# Patient Record
Sex: Male | Born: 1975 | Race: Black or African American | Hispanic: No | Marital: Single | Smoking: Never smoker
Health system: Southern US, Community
[De-identification: ages and names within clinical notes are randomized; demographics above are authoritative.]

---

## 2015-08-23 ENCOUNTER — Ambulatory Visit (INDEPENDENT_AMBULATORY_CARE_PROVIDER_SITE_OTHER): Payer: BLUE CROSS/BLUE SHIELD

## 2015-08-23 ENCOUNTER — Ambulatory Visit (INDEPENDENT_AMBULATORY_CARE_PROVIDER_SITE_OTHER): Payer: BLUE CROSS/BLUE SHIELD | Admitting: Physician Assistant

## 2015-08-23 VITALS — BP 142/104 | HR 112 | Temp 99.0°F | Resp 18 | Ht 73.0 in | Wt 255.0 lb

## 2015-08-23 DIAGNOSIS — M25474 Effusion, right foot: Secondary | ICD-10-CM

## 2015-08-23 DIAGNOSIS — M25571 Pain in right ankle and joints of right foot: Secondary | ICD-10-CM

## 2015-08-23 LAB — POCT CBC
Granulocyte percent: 80.5 %G — AB (ref 37–80)
HEMATOCRIT: 45.8 % (ref 43.5–53.7)
HEMOGLOBIN: 15.5 g/dL (ref 14.1–18.1)
LYMPH, POC: 2.2 (ref 0.6–3.4)
MCH: 27.5 pg (ref 27–31.2)
MCHC: 33.9 g/dL (ref 31.8–35.4)
MCV: 81 fL (ref 80–97)
MID (cbc): 0.5 (ref 0–0.9)
MPV: 6.6 fL (ref 0–99.8)
POC GRANULOCYTE: 11.4 — AB (ref 2–6.9)
POC LYMPH PERCENT: 15.6 %L (ref 10–50)
POC MID %: 3.9 % (ref 0–12)
Platelet Count, POC: 356 10*3/uL (ref 142–424)
RBC: 5.65 M/uL (ref 4.69–6.13)
RDW, POC: 14.8 %
WBC: 14.1 10*3/uL — AB (ref 4.6–10.2)

## 2015-08-23 LAB — URIC ACID: URIC ACID, SERUM: 8.7 mg/dL — AB (ref 4.0–7.8)

## 2015-08-23 MED ORDER — PREDNISONE 20 MG PO TABS: 40.0000 mg | ORAL_TABLET | Freq: Every day | ORAL | Status: DC

## 2015-08-23 NOTE — Patient Instructions (Signed)
Your toe pain is likely gout.  Please take the prednisone 2 tabs daily for 5 days. If you start having fevers or chills, notice the pain worsening, or notice redness expanding around the area, be sure to come back or go to the ER asap!   Gout Gout is an inflammatory arthritis caused by a buildup of uric acid crystals in the joints. Uric acid is a chemical that is normally present in the blood. When the level of uric acid in the blood is too high it can form crystals that deposit in your joints and tissues. This causes joint redness, soreness, and swelling (inflammation). Repeat attacks are common. Over time, uric acid crystals can form into masses (tophi) near a joint, destroying bone and causing disfigurement. Gout is treatable and often preventable. CAUSES  The disease begins with elevated levels of uric acid in the blood. Uric acid is produced by your body when it breaks down a naturally found substance called purines. Certain foods you eat, such as meats and fish, contain high amounts of purines. Causes of an elevated uric acid level include:  Being passed down from parent to child (heredity).  Diseases that cause increased uric acid production (such as obesity, psoriasis, and certain cancers).  Excessive alcohol use.  Diet, especially diets rich in meat and seafood.  Medicines, including certain cancer-fighting medicines (chemotherapy), water pills (diuretics), and aspirin.  Chronic kidney disease. The kidneys are no longer able to remove uric acid well.  Problems with metabolism. Conditions strongly associated with gout include:  Obesity.  High blood pressure.  High cholesterol.  Diabetes. Not everyone with elevated uric acid levels gets gout. It is not understood why some people get gout and others do not. Surgery, joint injury, and eating too much of certain foods are some of the factors that can lead to gout attacks. SYMPTOMS   An attack of gout comes on quickly. It causes  intense pain with redness, swelling, and warmth in a joint.  Fever can occur.  Often, only one joint is involved. Certain joints are more commonly involved:  Base of the big toe.  Knee.  Ankle.  Wrist.  Finger. Without treatment, an attack usually goes away in a few days to weeks. Between attacks, you usually will not have symptoms, which is different from many other forms of arthritis. DIAGNOSIS  Your caregiver will suspect gout based on your symptoms and exam. In some cases, tests may be recommended. The tests may include:  Blood tests.  Urine tests.  X-rays.  Joint fluid exam. This exam requires a needle to remove fluid from the joint (arthrocentesis). Using a microscope, gout is confirmed when uric acid crystals are seen in the joint fluid. TREATMENT  There are two phases to gout treatment: treating the sudden onset (acute) attack and preventing attacks (prophylaxis).  Treatment of an Acute Attack.  Medicines are used. These include anti-inflammatory medicines or steroid medicines.  An injection of steroid medicine into the affected joint is sometimes necessary.  The painful joint is rested. Movement can worsen the arthritis.  You may use warm or cold treatments on painful joints, depending which works best for you.  Treatment to Prevent Attacks.  If you suffer from frequent gout attacks, your caregiver may advise preventive medicine. These medicines are started after the acute attack subsides. These medicines either help your kidneys eliminate uric acid from your body or decrease your uric acid production. You may need to stay on these medicines for a very long  time.  The early phase of treatment with preventive medicine can be associated with an increase in acute gout attacks. For this reason, during the first few months of treatment, your caregiver may also advise you to take medicines usually used for acute gout treatment. Be sure you understand your caregiver's  directions. Your caregiver may make several adjustments to your medicine dose before these medicines are effective.  Discuss dietary treatment with your caregiver or dietitian. Alcohol and drinks high in sugar and fructose and foods such as meat, poultry, and seafood can increase uric acid levels. Your caregiver or dietitian can advise you on drinks and foods that should be limited. HOME CARE INSTRUCTIONS   Do not take aspirin to relieve pain. This raises uric acid levels.  Only take over-the-counter or prescription medicines for pain, discomfort, or fever as directed by your caregiver.  Rest the joint as much as possible. When in bed, keep sheets and blankets off painful areas.  Keep the affected joint raised (elevated).  Apply warm or cold treatments to painful joints. Use of warm or cold treatments depends on which works best for you.  Use crutches if the painful joint is in your leg.  Drink enough fluids to keep your urine clear or pale yellow. This helps your body get rid of uric acid. Limit alcohol, sugary drinks, and fructose drinks.  Follow your dietary instructions. Pay careful attention to the amount of protein you eat. Your daily diet should emphasize fruits, vegetables, whole grains, and fat-free or low-fat milk products. Discuss the use of coffee, vitamin C, and cherries with your caregiver or dietitian. These may be helpful in lowering uric acid levels.  Maintain a healthy body weight. SEEK MEDICAL CARE IF:   You develop diarrhea, vomiting, or any side effects from medicines.  You do not feel better in 24 hours, or you are getting worse. SEEK IMMEDIATE MEDICAL CARE IF:   Your joint becomes suddenly more tender, and you have chills or a fever. MAKE SURE YOU:   Understand these instructions.  Will watch your condition.  Will get help right away if you are not doing well or get worse.   This information is not intended to replace advice given to you by your health  care provider. Make sure you discuss any questions you have with your health care provider.   Document Released: 09/02/2000 Document Revised: 09/26/2014 Document Reviewed: 04/18/2012 Elsevier Interactive Patient Education [employer]

## 2015-08-23 NOTE — Progress Notes (Signed)
   Subjective:    Patient ID: Bruce Mooney, male    DOB: 1975-10-07, 39 y.o.   MRN: [id number]  Chief Complaint  Patient presents with  . Foot Swelling    right, last night, initially started about 1 week ago   Medications, allergies, past medical history, surgical history, family history, social history and problem list reviewed and updated.  HPI  [age] yom presents with right foot pain, swelling.   Symptoms started one week ago with pain/swelling across dorsum of right foot. No trauma. Persistent/moderate pain for several days. Yesterday afternoon he noticed the pain suddenly moving to area of lateral malleolus, severity much worse to 8/10. Has been constant since yesterday. No trauma. Has been too tender to put much weight on it since yest.   Denies fevers, chills. No injury. No hx or family hx gout. Drank large amount of beer (6) yesterday, more than normal.   Review of Systems See HPI     Objective:   Physical Exam  Constitutional: He appears well-developed and well-nourished.  Non-toxic appearance. He does not have a sickly appearance. He does not appear ill. No distress.  BP 142/104 mmHg  Pulse 112  Temp(Src) 99 F (37.2 C)  Resp 18  Ht 6\' 1"  (1.854 m)  Wt 255 lb (115.667 kg)  BMI 33.65 kg/m2  SpO2 98%   Musculoskeletal:       Right knee: Normal.       Feet:  Exquisite tenderness over, around right lateral malleolus. Warmth over area. Mild swelling over area. No erythema. Limited range of motion due to pain. Normal sensation. 2+ dp pulse.   Mild ttp across dorsum of foot.    [facility] reading (PRIMARY) by  Dr. Milus GlazierLauenstein. Right foot/ankle findings: Soft tissue swelling. No bony abnormality.   Results for orders placed or performed in visit on 08/23/15  POCT CBC  Result Value Ref Range   WBC 14.1 (A) 4.6 - 10.2 K/uL   Lymph, poc 2.2 0.6 - 3.4   POC LYMPH PERCENT 15.6 10 - 50 %L   MID (cbc) 0.5 0 - 0.9   POC MID % 3.9 0 - 12 %M   POC Granulocyte 11.4 (A) 2 - 6.9     Granulocyte percent 80.5 (A) 37 - 80 %G   RBC 5.65 4.69 - 6.13 M/uL   Hemoglobin 15.5 14.1 - 18.1 g/dL   HCT, POC 40.945.8 81.143.5 - 53.7 %   MCV 81.0 80 - 97 fL   MCH, POC 27.5 27 - 31.2 pg   MCHC 33.9 31.8 - 35.4 g/dL   RDW, POC 91.414.8 %   Platelet Count, POC 356 142 - 424 K/uL   MPV 6.6 0 - 99.8 fL       Assessment & Plan:   Right ankle pain - Plan: POCT CBC, Uric Acid, DG Foot Complete Right, DG Ankle Complete Right  Swelling of foot joint, right - Plan: DG Foot Complete Right, DG Ankle Complete Right --no trauma, xrays unremarkable --suspect gout as pain is exquisite, came on suddenly despite no trauma, warm to touch, drank 6 beers one day prior to onset --prednisone burst --checking uric acid today --leukocytosis noted, pt afebrile, pt instructed to rtc if pain worsens, area becomes red, or he starts having fevers or chills  Donnajean Lopesodd M. Valmai Vandenberghe, PA-C Physician Assistant-Certified [facility]  08/23/2015 11:15 AM

## 2016-01-25 ENCOUNTER — Ambulatory Visit (INDEPENDENT_AMBULATORY_CARE_PROVIDER_SITE_OTHER): Payer: 59 | Admitting: Family Medicine

## 2016-01-25 VITALS — BP 136/92 | HR 106 | Temp 98.3°F | Resp 18 | Ht 73.0 in | Wt 253.0 lb

## 2016-01-25 DIAGNOSIS — M10072 Idiopathic gout, left ankle and foot: Secondary | ICD-10-CM

## 2016-01-25 MED ORDER — INDOMETHACIN 50 MG PO CAPS: 50.0000 mg | ORAL_CAPSULE | Freq: Three times a day (TID) | ORAL | Status: AC | PRN

## 2016-01-25 MED ORDER — PREDNISONE 20 MG PO TABS: 40.0000 mg | ORAL_TABLET | Freq: Every day | ORAL | Status: AC

## 2016-01-25 NOTE — Progress Notes (Signed)
Subjective:  By signing my name below, I, Bruce Mooney, attest that this documentation has been prepared under the direction and in the presence of Norberto Sorenson, MD. Electronically Signed: Stann Mooney, Scribe. 01/25/2016 , 5:36 PM .  Patient was seen in Room 2 .   Patient ID: Bruce Mooney, male    DOB: 05/04/1976, 40 y.o.   MRN: [id number] Chief Complaint  Patient presents with  . Foot Injury    pain, left, x 1 day   HPI Bruce Mooney is a 40 y.o. male who has h/o gout presents to [facility] complaining of left foot pain that started last night.  He was seen for right foot pain and had gout 5 months ago, as uric acid was 8.7. He did have white blood count of 14 with left shift. He was treated with prednisone burst.   Patient states left foot pain starting "out of the blue" last night. He's been trying to keep a controlled diet with baked chicken and steamed vegetables. But he did mention attending a barbeque over the past 2 days. He denies it feeling like previous gout flare. He denies any known injury to the area. He denies any recent change in his shoes. He denies taking OTC medications for this.   He has history of heel spurs and was seen at Urgent Care in [place]. He was sent to podiatry in [facility] for shoe slips for relief. He mentions occasional popping in his lower extremities.   He informs seeing his eye doctor this morning for a stye in his left eye. He was prescribed doxycycline.   History reviewed. No pertinent past medical history. Prior to Admission medications   Not on File   No Known Allergies  Review of Systems  Constitutional: Negative for fever, chills and fatigue.  Gastrointestinal: Negative for nausea, vomiting and diarrhea.  Musculoskeletal: Positive for joint swelling, arthralgias and gait problem. Negative for myalgias.  Skin: Negative for color change, rash and wound.       Objective:   Physical Exam  Constitutional: He is oriented to  person, place, and time. He appears well-developed and well-nourished. No distress.  HENT:  Head: Normocephalic and atraumatic.  Eyes: EOM are normal. Pupils are equal, round, and reactive to light.  Neck: Neck supple.  Cardiovascular: Normal rate.   Pulses:      Dorsalis pedis pulses are 2+ on the left side.       Posterior tibial pulses are 2+ on the left side.  Pulmonary/Chest: Effort normal. No respiratory distress.  Musculoskeletal: Normal range of motion.  Tender anterior to the lateral malleolus, ankle and lateral proximal foot with warmth and slight swelling; no erythema, no hyperesthesia; no tenderness over 5th metatarsal, trace pitting edema Antalgic gait with slight inversion of the ankle, slightly rolled to the medial aspect of the foot  Neurological: He is alert and oriented to person, place, and time.  Skin: Skin is warm and dry.  Psychiatric: He has a normal mood and affect. His behavior is normal.  Nursing note and vitals reviewed.   BP 136/92 mmHg  Pulse 106  Temp(Src) 98.3 F (36.8 C)  Resp 18  Ht  (1.854 m)  Wt 253 lb (114.76 kg)  BMI 33.39 kg/m2  SpO2 98%    Assessment & Plan:   1. Acute idiopathic gout of left foot   Reviewed diet changes to prevent gout flairs. Treat with prednisone which he responded very well to prior.  In future, can start indomethacin  at first sign of flair to try to abort. If cont to have gout flairs will try colcrys and cons preventative allopurinol after rechecking uric acid for baseline.  Meds ordered this encounter  Medications  . predniSONE (DELTASONE) 20 MG tablet    Sig: Take 2 tablets (40 mg total) by mouth daily with breakfast.    Dispense:  10 tablet    Refill:  0  . indomethacin (INDOCIN) 50 MG capsule    Sig: Take 1 capsule (50 mg total) by mouth 3 (three) times daily as needed for moderate pain (at first sign of gout flair, take with food).    Dispense:  30 capsule    Refill:  2    I personally performed the  services described in this documentation, which was scribed in my presence. The recorded information has been reviewed and considered, and addended by me as needed.  Norberto SorensonEva Shaw, MD MPH

## 2016-01-25 NOTE — Patient Instructions (Addendum)
You can reduce your uric acid level by avoiding red meat, saturated fats (dairy fats, butter fats, animal fats), high fructose corn syrup, and beer and drinking more water.    IF you received an x-ray today, you will receive an invoice from [facility]. Please contact [facility] at [phone] with questions or concerns regarding your invoice.   IF you received labwork today, you will receive an invoice from [employer]. Please contact [facility] at [phone] with questions or concerns regarding your invoice.   Our billing staff will not be able to assist you with questions regarding bills from these companies.  You will be contacted with the lab results as soon as they are available. The fastest way to get your results is to activate your My Chart account. Instructions are located on the last page of this paperwork. If you have not heard from [place] regarding the results in 2 weeks, please contact this office.     Gout Gout is an inflammatory arthritis caused by a buildup of uric acid crystals in the joints. Uric acid is a chemical that is normally present in the blood. When the level of uric acid in the blood is too high it can form crystals that deposit in your joints and tissues. This causes joint redness, soreness, and swelling (inflammation). Repeat attacks are common. Over time, uric acid crystals can form into masses (tophi) near a joint, destroying bone and causing disfigurement. Gout is treatable and often preventable. CAUSES  The disease begins with elevated levels of uric acid in the blood. Uric acid is produced by your body when it breaks down a naturally found substance called purines. Certain foods you eat, such as meats and fish, contain high amounts of purines. Causes of an elevated uric acid level include:  Being passed down from parent to child (heredity).  Diseases that cause increased uric acid production (such as obesity,  psoriasis, and certain cancers).  Excessive alcohol use.  Diet, especially diets rich in meat and seafood.  Medicines, including certain cancer-fighting medicines (chemotherapy), water pills (diuretics), and aspirin.  Chronic kidney disease. The kidneys are no longer able to remove uric acid well.  Problems with metabolism. Conditions strongly associated with gout include:  Obesity.  High blood pressure.  High cholesterol.  Diabetes. Not everyone with elevated uric acid levels gets gout. It is not understood why some people get gout and others do not. Surgery, joint injury, and eating too much of certain foods are some of the factors that can lead to gout attacks. SYMPTOMS   An attack of gout comes on quickly. It causes intense pain with redness, swelling, and warmth in a joint.  Fever can occur.  Often, only one joint is involved. Certain joints are more commonly involved:  Base of the big toe.  Knee.  Ankle.  Wrist.  Finger. Without treatment, an attack usually goes away in a few days to weeks. Between attacks, you usually will not have symptoms, which is different from many other forms of arthritis. DIAGNOSIS  Your caregiver will suspect gout based on your symptoms and exam. In some cases, tests may be recommended. The tests may include:  Blood tests.  Urine tests.  X-rays.  Joint fluid exam. This exam requires a needle to remove fluid from the joint (arthrocentesis). Using a microscope, gout is confirmed when uric acid crystals are seen in the joint fluid. TREATMENT  There are two phases to gout treatment: treating the sudden onset (acute) attack and  preventing attacks (prophylaxis).  Treatment of an Acute Attack.  Medicines are used. These include anti-inflammatory medicines or steroid medicines.  An injection of steroid medicine into the affected joint is sometimes necessary.  The painful joint is rested. Movement can worsen the arthritis.  You may  use warm or cold treatments on painful joints, depending which works best for you.  Treatment to Prevent Attacks.  If you suffer from frequent gout attacks, your caregiver may advise preventive medicine. These medicines are started after the acute attack subsides. These medicines either help your kidneys eliminate uric acid from your body or decrease your uric acid production. You may need to stay on these medicines for a very long time.  The early phase of treatment with preventive medicine can be associated with an increase in acute gout attacks. For this reason, during the first few months of treatment, your caregiver may also advise you to take medicines usually used for acute gout treatment. Be sure you understand your caregiver's directions. Your caregiver may make several adjustments to your medicine dose before these medicines are effective.  Discuss dietary treatment with your caregiver or dietitian. Alcohol and drinks high in sugar and fructose and foods such as meat, poultry, and seafood can increase uric acid levels. Your caregiver or dietitian can advise you on drinks and foods that should be limited. HOME CARE INSTRUCTIONS   Do not take aspirin to relieve pain. This raises uric acid levels.  Only take over-the-counter or prescription medicines for pain, discomfort, or fever as directed by your caregiver.  Rest the joint as much as possible. When in bed, keep sheets and blankets off painful areas.  Keep the affected joint raised (elevated).  Apply warm or cold treatments to painful joints. Use of warm or cold treatments depends on which works best for you.  Use crutches if the painful joint is in your leg.  Drink enough fluids to keep your urine clear or pale yellow. This helps your body get rid of uric acid. Limit alcohol, sugary drinks, and fructose drinks.  Follow your dietary instructions. Pay careful attention to the amount of protein you eat. Your daily diet should  emphasize fruits, vegetables, whole grains, and fat-free or low-fat milk products. Discuss the use of coffee, vitamin C, and cherries with your caregiver or dietitian. These may be helpful in lowering uric acid levels.  Maintain a healthy body weight. SEEK MEDICAL CARE IF:   You develop diarrhea, vomiting, or any side effects from medicines.  You do not feel better in 24 hours, or you are getting worse. SEEK IMMEDIATE MEDICAL CARE IF:   Your joint becomes suddenly more tender, and you have chills or a fever. MAKE SURE YOU:   Understand these instructions.  Will watch your condition.  Will get help right away if you are not doing well or get worse.   This information is not intended to replace advice given to you by your health care provider. Make sure you discuss any questions you have with your health care provider.   Document Released: 09/02/2000 Document Revised: 09/26/2014 Document Reviewed: 04/18/2012 Elsevier Interactive Patient Education 2016 [employer]  There are some easy diet adjustments that you can make to lower your blood uric acid level and thereby hopefully never have to suffer from another gout flair (or at least less frequently).  You should avoid alcohol, drink plenty of water, and try to follow a "low purine" diet as your body produces uric acid when it breaks down  prurines-substances that are found naturally in your body, as well as in certain foods such as organ meats, anchioves, herring, asparagus, and mushrooms. Also, increasing your diet in certain foods that may lower uric acid levels is a pretty safe way to decrease your likelihood of gout so consider drinking coffee (regular and/or decaf), eating fruits with Vitamin C in them such as citrus fruits, strawberries, broccoli,  brussel sprouts, papaya, and cantaloupe (though megadoses of vitamin C supplements may do the opposite and increase your body's uric acid levels), and/or eating more cherries and other  dark-colored fruits, such as blackberries, blueberries, purple grapes and raspberries. In addition, getting plenty of vitamin A though yellow fruits, or dark green/yellow vegetables at least every other day is good.  Other general diet guidelines for people with gout who need to lower their blood uric acid levels are as follows:  Drink 8 to 16 cups ( about 2 to 4 liters) of fluid each day, with at least half being water. Eat a moderate amount of protein, preferably from healthy sources, such as low-fat or fat-free dairy, tofu, eggs, and nut butters. Limit your daily intake of meat, fish, and poultry to 4 to 6 ounces. Avoid high fat meats and desserts. Decrease your intake of shellfish, beef, lamb, pork, eggs and cheese. Avoid drastic weight reduction or fasting.  If weight loss is desired lose it over a period of several months.

## 2017-03-08 ENCOUNTER — Telehealth: Payer: Self-pay | Admitting: Family Medicine

## 2017-03-08 NOTE — Telephone Encounter (Signed)
PATIENT WOULD LIKE TO ASK DR. SHAW IF SHE WILL REFILL HIS MEDICATION FOR HIS GOUT? IT IS INDOMETHACIN (INDOCIN) 50 MG. HE GOT A FLARE-UP ON Friday 03/03/17 IN HIS (R) FOOT.  BEST PHONE [phone] (CELL) PHARMACY CHOICE IS [facility] ON [address]. MBC

## 2017-03-09 NOTE — Telephone Encounter (Signed)
Spoke with patient and advised that he needed to come in for an OV since its been over a year since he has been seen, to get refills on medication for his gout. Pt denied being transferred to the front desk to make an appointment and stated that he would call back if it gets worse.

## 2017-03-09 NOTE — Telephone Encounter (Signed)
Dr. Shaw, please see this message. Thanks. 

## 2017-03-09 NOTE — Telephone Encounter (Signed)
No - I cannot refill since he has not been seen in our office in > 1  Yr. Please offer same day appt with other provider. Or I can see him on Sat

## 2017-05-23 IMAGING — CR DG ANKLE COMPLETE 3+V*R*
4 series · 4 of 4 positions shown · non-contrast
Comparison: None.

CLINICAL DATA: Foot and ankle pain of unknown origin.

EXAM:
RIGHT FOOT COMPLETE - 3+ VIEW; RIGHT ANKLE - COMPLETE 3+ VIEW

[AP]
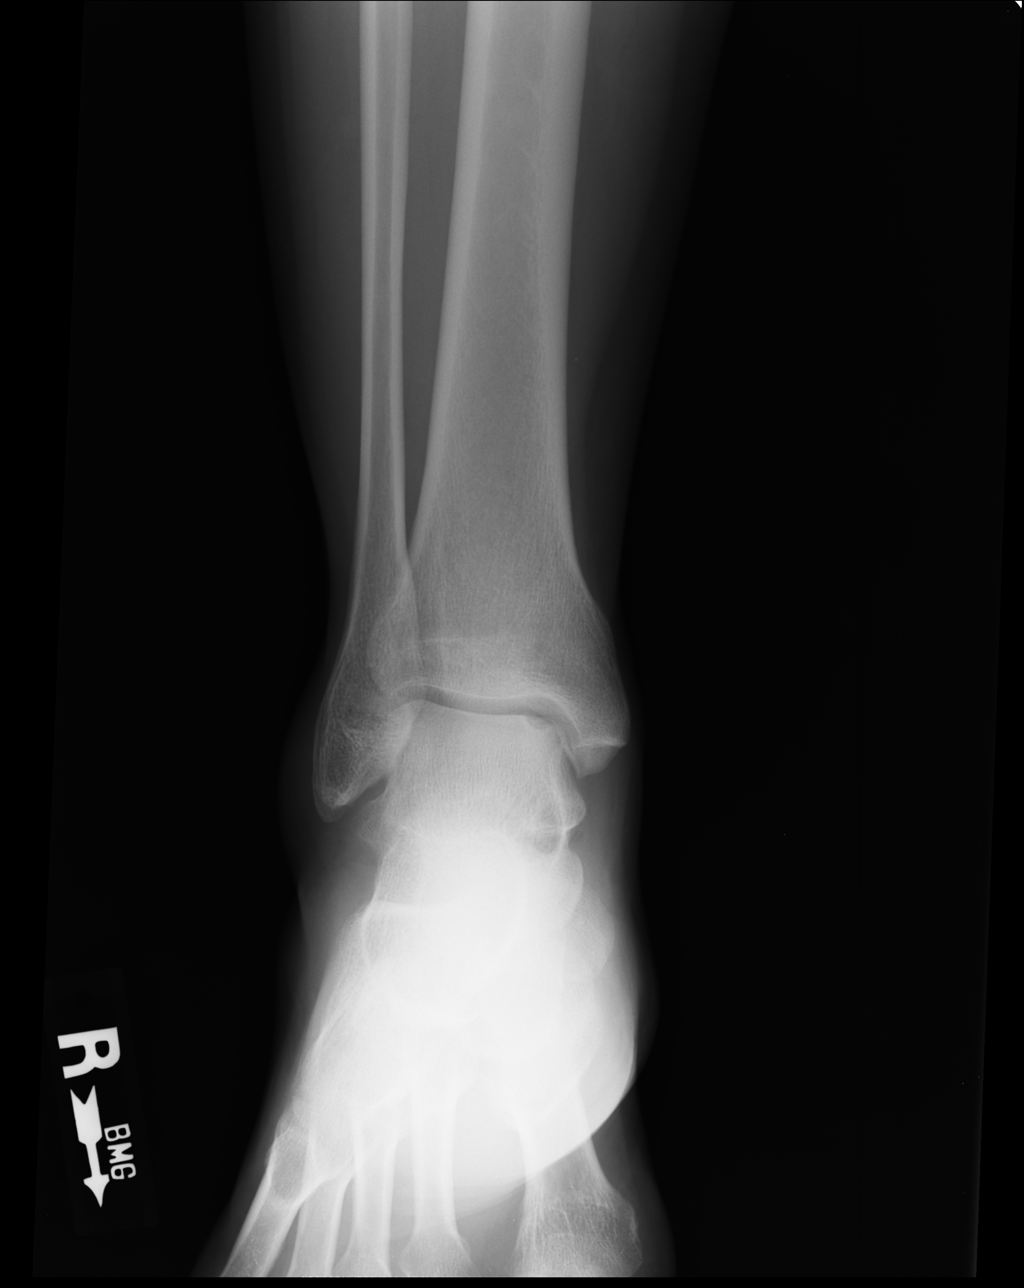

[ap obl int rot]
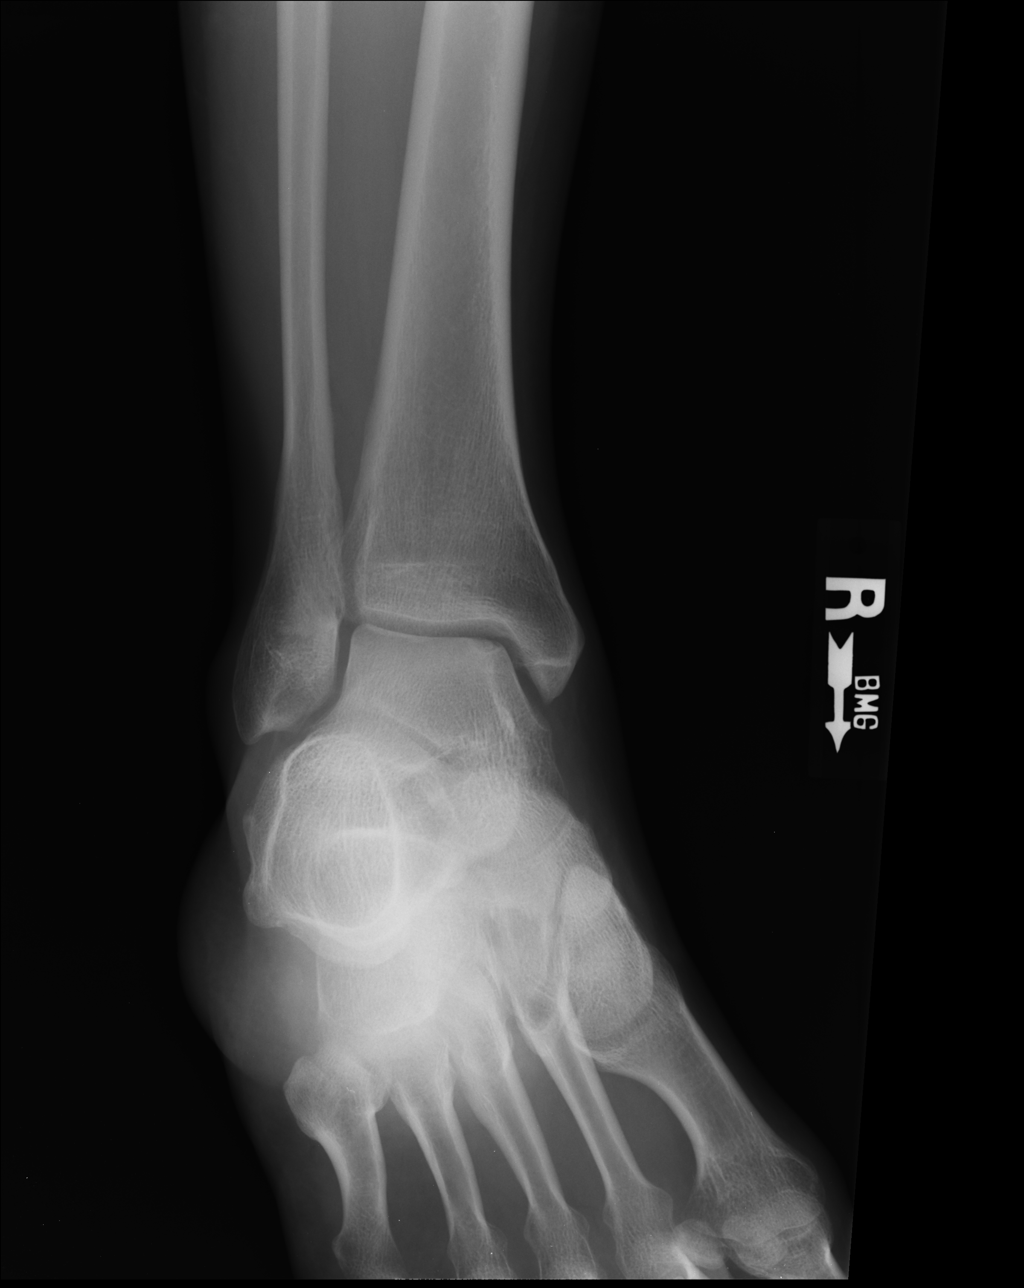

[medial obl]
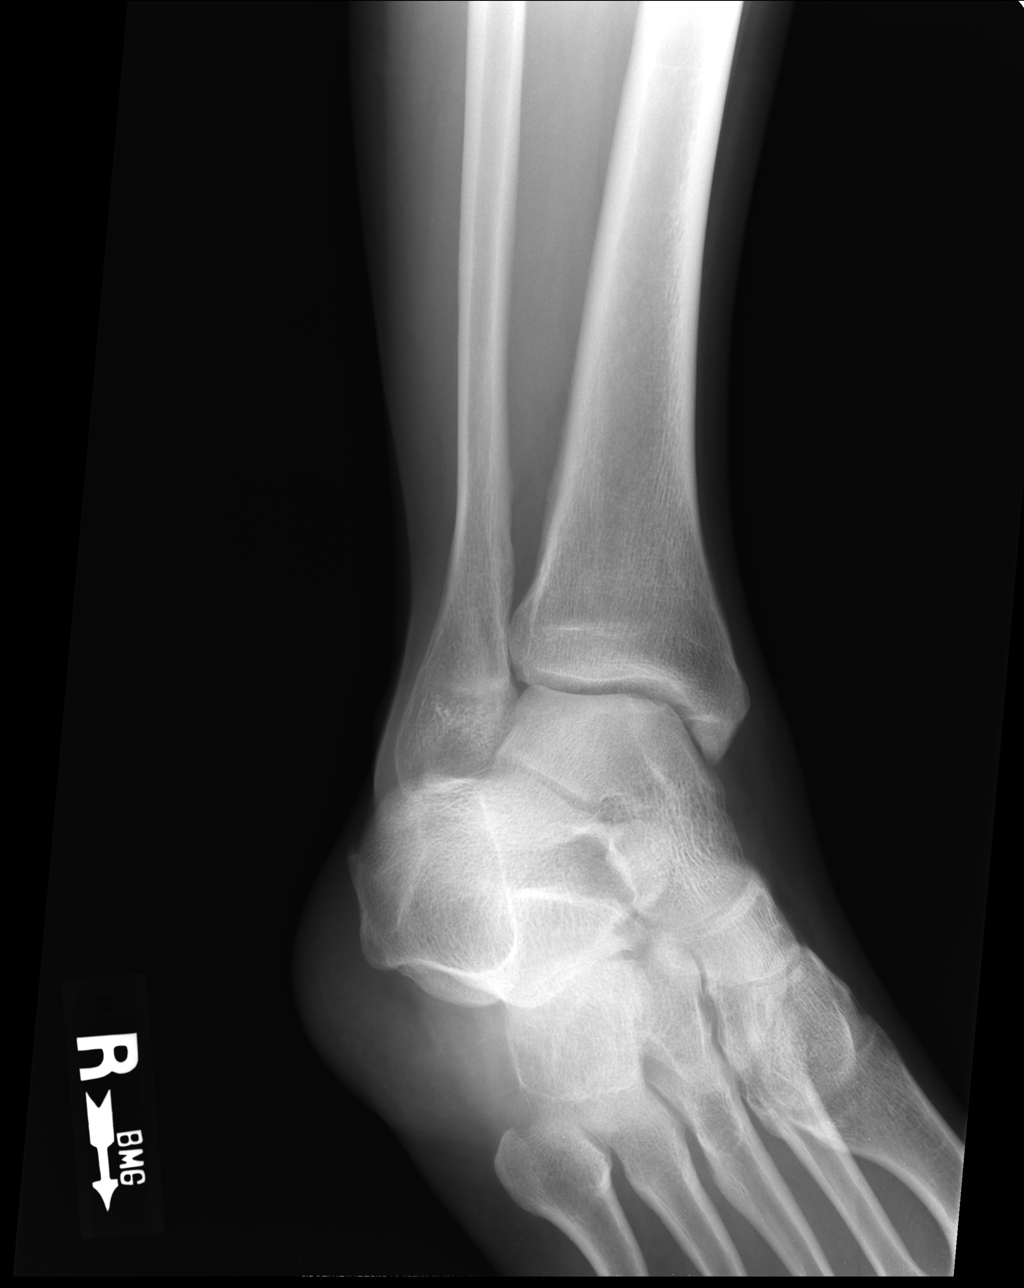

[lateral]
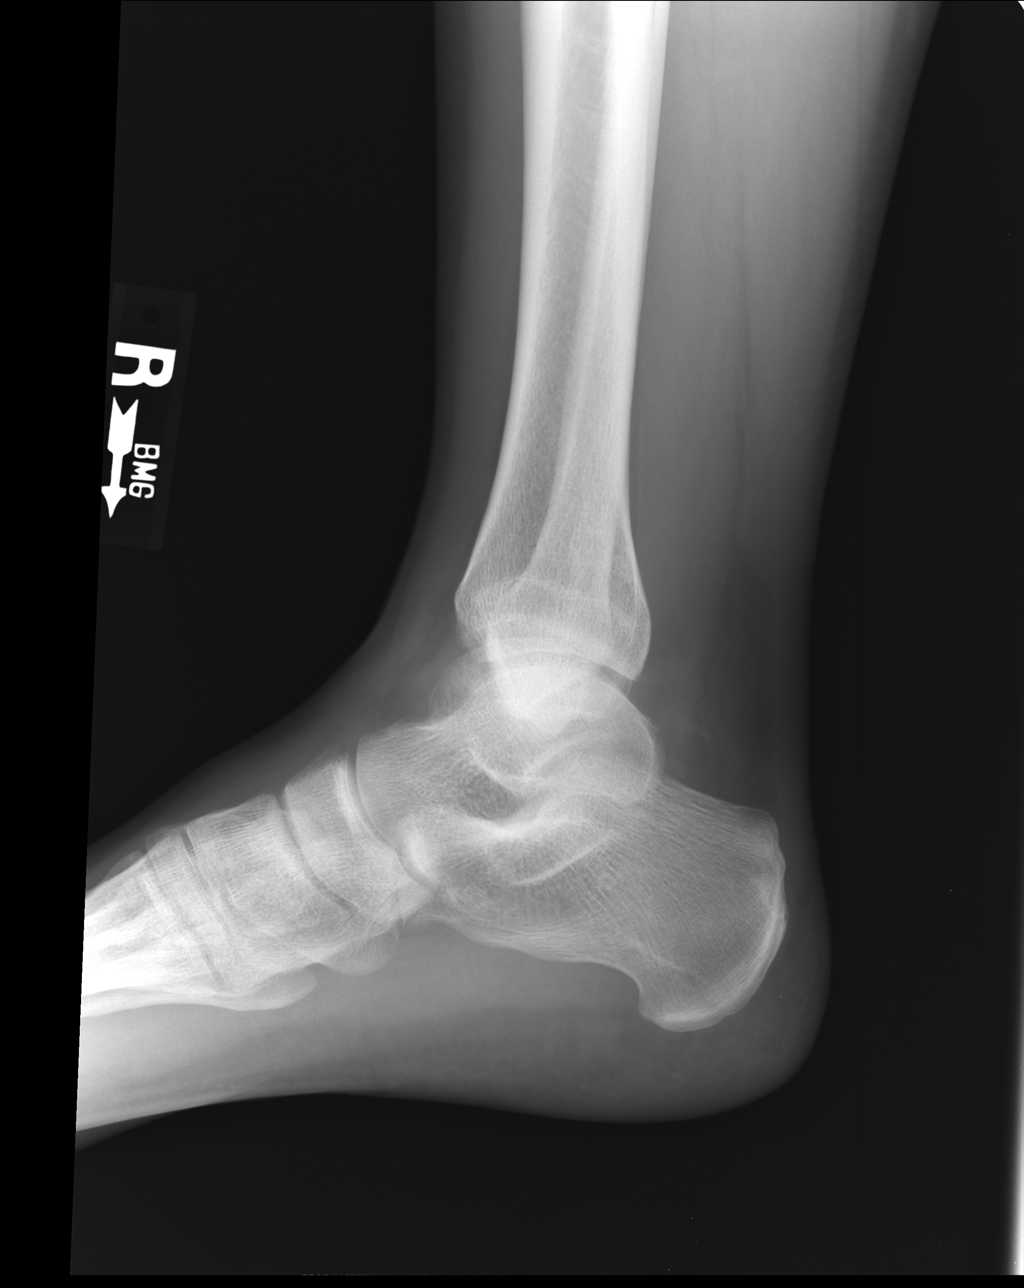

[4 of 4 positions shown; findings below may reference images not displayed]

FINDINGS: There is no evidence of fracture or dislocation. There is no
evidence of arthropathy or other focal bone abnormality. Evaluation
of the soft tissues demonstrates lateral ankle and foot swelling.
IMPRESSION: No acute fracture or dislocation identified about the right foot or
ankle.

Lateral right foot and ankle soft tissue swelling.
